# Patient Record
Sex: Male | Born: 1985 | Race: White | Hispanic: Yes | Marital: Single | Smoking: Former smoker
Health system: Southern US, Community
[De-identification: ages and names within clinical notes are randomized; demographics above are authoritative.]

---
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
## 2014-07-02 ENCOUNTER — Emergency Department (HOSPITAL_COMMUNITY)
Admission: EM | Admit: 2014-07-02 | Discharge: 2014-07-02 | Disposition: A | Discharge status: Home / Self Care | Payer: Non-veteran care | Attending: Emergency Medicine | Admitting: Emergency Medicine

## 2014-07-02 ENCOUNTER — Emergency Department (HOSPITAL_COMMUNITY): Payer: Non-veteran care

## 2014-07-02 ENCOUNTER — Encounter (HOSPITAL_COMMUNITY): Payer: Self-pay | Admitting: Emergency Medicine

## 2014-07-02 DIAGNOSIS — J069 Acute upper respiratory infection, unspecified: Secondary | ICD-10-CM | POA: Insufficient documentation

## 2014-07-02 LAB — COMPREHENSIVE METABOLIC PANEL
ALBUMIN: 4 g/dL (ref 3.5–5.2)
ALT: 27 U/L (ref 0–53)
ANION GAP: 18 — AB (ref 5–15)
AST: 24 U/L (ref 0–37)
Alkaline Phosphatase: 75 U/L (ref 39–117)
BUN: 10 mg/dL (ref 6–23)
CALCIUM: 9.4 mg/dL (ref 8.4–10.5)
CO2: 21 mEq/L (ref 19–32)
CREATININE: 0.89 mg/dL (ref 0.50–1.35)
Chloride: 96 mEq/L (ref 96–112)
GFR calc Af Amer: >90 mL/min (ref >90)
GFR calc non Af Amer: >90 mL/min (ref >90)
Glucose, Bld: 106 mg/dL — ABNORMAL HIGH (ref 70–99)
Potassium: 3.9 mEq/L (ref 3.7–5.3)
Sodium: 135 mEq/L — ABNORMAL LOW (ref 137–147)
TOTAL PROTEIN: 7.6 g/dL (ref 6.0–8.3)
Total Bilirubin: 0.4 mg/dL (ref 0.3–1.2)

## 2014-07-02 LAB — CBC WITH DIFFERENTIAL/PLATELET
BASOS ABS: 0 10*3/uL (ref 0.0–0.1)
BASOS PCT: 0 % (ref 0–1)
EOS ABS: 0 10*3/uL (ref 0.0–0.7)
Eosinophils Relative: 0 % (ref 0–5)
HCT: 36.8 % — ABNORMAL LOW (ref 39.0–52.0)
HEMOGLOBIN: 12.3 g/dL — AB (ref 13.0–17.0)
Lymphocytes Relative: 17 % (ref 12–46)
Lymphs Abs: 1.4 10*3/uL (ref 0.7–4.0)
MCH: 28.3 pg (ref 26.0–34.0)
MCHC: 33.4 g/dL (ref 30.0–36.0)
MCV: 84.6 fL (ref 78.0–100.0)
MONO ABS: 1.2 10*3/uL — AB (ref 0.1–1.0)
MONOS PCT: 14 % — AB (ref 3–12)
NEUTROS ABS: 5.8 10*3/uL (ref 1.7–7.7)
Neutrophils Relative %: 69 % (ref 43–77)
Platelets: 302 10*3/uL (ref 150–400)
RBC: 4.35 MIL/uL (ref 4.22–5.81)
RDW: 12.3 % (ref 11.5–15.5)
WBC: 8.5 10*3/uL (ref 4.0–10.5)

## 2014-07-02 LAB — RAPID STREP SCREEN (MED CTR MEBANE ONLY): STREPTOCOCCUS, GROUP A SCREEN (DIRECT): NEGATIVE

## 2014-07-02 MED ORDER — TRAMADOL HCL 50 MG PO TABS
50.0000 mg | ORAL_TABLET | Freq: Once | ORAL | Status: AC
  Administered 2014-07-02: 50 mg via ORAL
  Filled 2014-07-02: qty 1

## 2014-07-02 MED ORDER — PSEUDOEPHEDRINE HCL ER 120 MG PO TB12: 120.0000 mg | ORAL_TABLET | Freq: Two times a day (BID) | ORAL | Status: DC

## 2014-07-02 NOTE — ED Notes (Signed)
Patient here after returning from vacation in [place]. States that he was on a cruise with his family. Nobody else in his family is sick after returning from the trip. Patient currently endorsing symptoms of fever, chills, night sweats, headache, cough, and sore throat. Symptoms began before returning from trip and have persisted for about a week.

## 2014-07-02 NOTE — Discharge Instructions (Signed)
Cool Mist Vaporizers Vaporizers may help relieve the symptoms of a cough and cold. They add moisture to the air, which helps mucus to become thinner and less sticky. This makes it easier to breathe and cough up secretions. Cool mist vaporizers do not cause serious burns like hot mist vaporizers, which may also be called steamers or humidifiers. Vaporizers have not been proven to help with colds. You should not use a vaporizer if you are allergic to mold. HOME CARE INSTRUCTIONS  Follow the package instructions for the vaporizer.  Do not use anything other than distilled water in the vaporizer.  Do not run the vaporizer all of the time. This can cause mold or bacteria to grow in the vaporizer.  Clean the vaporizer after each time it is used.  Clean and dry the vaporizer well before storing it.  Stop using the vaporizer if worsening respiratory symptoms develop. Document Released: 09/07/2004 Document Revised: 12/16/2013 Document Reviewed: 04/30/2013 [facility] Patient Information 2015 [place], [place]. This information is not intended to replace advice given to you by your health care provider. Make sure you discuss any questions you have with your health care provider.  Antibiotic Resistance Antibiotics are drugs. They fight infections caused by bacteria. Antibiotics greatly reduce illness and death from infectious diseases. Over time, the bacteria that antibiotics once controlled are much harder to kill. CAUSES  Antibiotic resistance occurs when bacteria change in some way. These changes can lessen the abilities of drugs designed to cure infections. The over-use of antibiotics can cause antibiotic resistance. Almost all important bacterial infections in the world are becoming resistant to drugs. Antibiotic resistance has been called one of the world's most pressing public health problems.  Antibiotics should be used to treat bacterial infections. But they are not effective against viral infections.  These include the common cold, most sore throats, and the flu. Smart use of antibiotics will control the spread of resistance.  TREATMENT   Only use antibiotics as prescribed by your caregiver.  Talk with your caregiver about antibiotic resistance.  Ask what else you can do to feel better.  Do not take an antibiotic for a viral infection. This could be a cold, cough or the flu.  Do not save some of your antibiotic for the next time you get sick.  Take an antibiotic exactly as the caregiver tells you.  Do not take an antibiotic that is prescribed for someone else.  Use the antibiotic as directed. Take the correct dose at the scheduled time. SEEK MEDICAL CARE IF:  You react to the antibiotic with:  A rash.  Itching.  An upset stomach. Document Released: 03/03/2003 Document Revised: 03/04/2012 Document Reviewed: 10/05/2008 [facility] Patient Information 2015 [place]Care, [place]LL[place]. This information is not intended to replace advice given to you by your health care provider. Make sure you discuss any questions you have with your health care provider.

## 2014-07-02 NOTE — ED Notes (Signed)
The pt has been ill for 2-3 days with temp and headache.  Strep screen sent to lab.  Med given

## 2014-07-02 NOTE — ED Provider Notes (Signed)
CSN: [id number]     Arrival date & time 07/02/14  0302 History   First MD Initiated Contact with Patient 07/02/14 [phone]     Chief Complaint  Patient presents with  . Fever  . Headache  . Sore Throat     (Consider location/radiation/quality/duration/timing/severity/associated sxs/prior Treatment) HPI Patient is a generally healthy man in his early 30s who presents with complaints of nasal congestion, runny nose and sinus pressure. He notes that he returned from vacation in the [place] about 3 days ago and his symptoms began at that time. The patient also has a sore throat. He denies cough and shortness of breath. He has had a subjective fever but has not checked his temperature at home. His by mouth intake is a mildly diminished. He has no known ill contacts.  History reviewed. No pertinent past medical history. History reviewed. No pertinent past surgical history. No family history on file. History  Substance Use Topics  . Smoking status: Never Smoker   . Smokeless tobacco: Not on file  . Alcohol Use: No    Review of Systems  Ten point review of symptoms performed and is negative with the exception of symptoms noted above.   Allergies  Review of patient's allergies indicates no known allergies.  Home Medications   Prior to Admission medications   Not on File   BP 95/65  Pulse 102  Temp(Src) 98.7 F (37.1 C) (Oral)  Resp 20  Ht 5\' 7"  (1.702 m)  Wt 155 lb (70.308 kg)  BMI 24.27 kg/m2  SpO2 98% Physical Exam Gen: well developed and well nourished appearing Head: NCAT Eyes: PERL, EOMI Nose: no epistaixis or rhinorrhea No ttp on palpation of the maxillary and frontal sinuses Mouth/throat: mucosa is moist and pink Neck: supple, no stridor Lungs: CTA B, no wheezing, rhonchi or rales CV: RRR, no murmur, extremities appear well perfused.  Abd: soft, notender, nondistended Back: no ttp, no cva ttp Skin: warm and dry Ext: normal to inspection, no dependent edema Neuro:  CN ii-xii grossly intact, no focal deficits Psyche; normal affect,  calm and cooperative.   ED Course  Procedures (including critical care time) Labs Review  Results for orders placed during the hospital encounter of 07/02/14 (from the past 24 hour(s))  CBC WITH DIFFERENTIAL     Status: Abnormal   Collection Time    07/02/14  3:20 AM      Result Value Ref Range   WBC 8.5  4.0 - 10.5 K/uL   RBC 4.35  4.22 - 5.81 MIL/uL   Hemoglobin 12.3 (*) 13.0 - 17.0 g/dL   HCT 11.9 (*) 14.7 - 82.9 %   MCV 84.6  78.0 - 100.0 fL   MCH 28.3  26.0 - 34.0 pg   MCHC 33.4  30.0 - 36.0 g/dL   RDW 56.2  13.0 - 86.5 %   Platelets 302  150 - 400 K/uL   Neutrophils Relative % 69  43 - 77 %   Neutro Abs 5.8  1.7 - 7.7 K/uL   Lymphocytes Relative 17  12 - 46 %   Lymphs Abs 1.4  0.7 - 4.0 K/uL   Monocytes Relative 14 (*) 3 - 12 %   Monocytes Absolute 1.2 (*) 0.1 - 1.0 K/uL   Eosinophils Relative 0  0 - 5 %   Eosinophils Absolute 0.0  0.0 - 0.7 K/uL   Basophils Relative 0  0 - 1 %   Basophils Absolute 0.0  0.0 - 0.1 K/uL  COMPREHENSIVE METABOLIC PANEL     Status: Abnormal   Collection Time    07/02/14  3:20 AM      Result Value Ref Range   Sodium 135 (*) 137 - 147 mEq/L   Potassium 3.9  3.7 - 5.3 mEq/L   Chloride 96  96 - 112 mEq/L   CO2 21  19 - 32 mEq/L   Glucose, Bld 106 (*) 70 - 99 mg/dL   BUN 10  6 - 23 mg/dL   Creatinine, Ser 1.610.89  0.50 - 1.35 mg/dL   Calcium 9.4  8.4 - 09.610.5 mg/dL   Total Protein 7.6  6.0 - 8.3 g/dL   Albumin 4.0  3.5 - 5.2 g/dL   AST 24  0 - 37 U/L   ALT 27  0 - 53 U/L   Alkaline Phosphatase 75  39 - 117 U/L   Total Bilirubin 0.4  0.3 - 1.2 mg/dL   GFR calc non Af Amer >90  >90 mL/min   GFR calc Af Amer >90  >90 mL/min   Anion gap 18 (*) 5 - 15    Imaging Review Dg Chest 2 View  07/02/2014   CLINICAL DATA:  Productive cough and fever for 2 weeks. Headache. Smoker.  EXAM: CHEST  2 VIEW  COMPARISON:  None.  FINDINGS: The heart size and mediastinal contours are within  normal limits. Both lungs are clear. The visualized skeletal structures are unremarkable.  IMPRESSION: No active cardiopulmonary disease.   Electronically Signed   By: Burman NievesWilliam  Stevens M.D.   On: 07/02/2014 04:07      MDM   DDX: sinusitis, URI, pharyngitis  PE is not consistent with sinusitis. We are checking rapid strep. Suspect this is a simple URI and plan to manage symptomatically with plan for outpatient f/u and counsel re: return precautions.     Brandt LoosenJulie Alinda Egolf, MD 07/02/14 [phone]

## 2014-07-03 LAB — CULTURE, GROUP A STREP

## 2014-07-12 ENCOUNTER — Encounter (HOSPITAL_COMMUNITY): Payer: Self-pay | Admitting: Emergency Medicine

## 2014-07-12 ENCOUNTER — Emergency Department (HOSPITAL_COMMUNITY): Payer: Non-veteran care

## 2014-07-12 ENCOUNTER — Emergency Department (HOSPITAL_COMMUNITY)
Admission: EM | Admit: 2014-07-12 | Discharge: 2014-07-12 | Disposition: A | Discharge status: Home / Self Care | Payer: Non-veteran care | Attending: Emergency Medicine | Admitting: Emergency Medicine

## 2014-07-12 DIAGNOSIS — M79609 Pain in unspecified limb: Secondary | ICD-10-CM

## 2014-07-12 DIAGNOSIS — M7989 Other specified soft tissue disorders: Secondary | ICD-10-CM

## 2014-07-12 DIAGNOSIS — L02619 Cutaneous abscess of unspecified foot: Secondary | ICD-10-CM | POA: Insufficient documentation

## 2014-07-12 DIAGNOSIS — F17200 Nicotine dependence, unspecified, uncomplicated: Secondary | ICD-10-CM | POA: Insufficient documentation

## 2014-07-12 DIAGNOSIS — L03119 Cellulitis of unspecified part of limb: Principal | ICD-10-CM

## 2014-07-12 DIAGNOSIS — Z79899 Other long term (current) drug therapy: Secondary | ICD-10-CM | POA: Insufficient documentation

## 2014-07-12 DIAGNOSIS — L03116 Cellulitis of left lower limb: Secondary | ICD-10-CM

## 2014-07-12 MED ORDER — CEPHALEXIN 500 MG PO CAPS: 500.0000 mg | ORAL_CAPSULE | Freq: Three times a day (TID) | ORAL | Status: DC

## 2014-07-12 MED ORDER — HYDROCODONE-ACETAMINOPHEN 5-325 MG PO TABS
1.0000 | ORAL_TABLET | Freq: Once | ORAL | Status: AC
  Administered 2014-07-12: 1 via ORAL
  Filled 2014-07-12: qty 1

## 2014-07-12 MED ORDER — SULFAMETHOXAZOLE-TRIMETHOPRIM 800-160 MG PO TABS: 1.0000 | ORAL_TABLET | Freq: Two times a day (BID) | ORAL | Status: AC

## 2014-07-12 MED ORDER — HYDROCODONE-ACETAMINOPHEN 5-325 MG PO TABS: 1.0000 | ORAL_TABLET | Freq: Four times a day (QID) | ORAL | Status: AC | PRN

## 2014-07-12 NOTE — ED Provider Notes (Signed)
CSN: [id number]     Arrival date & time 07/12/14  1130 History  This chart was scribed for Mario CaptainAbigail Arabell Neria, PA-C,  non-physician practitioner working with Mario Rollins PayorPickering, MD by Mario Rollins, ED scribe. This patient was seen in room TR11C/TR11C and the patient's care was started at 2:40 PM.    Chief Complaint  Patient presents with  . Foot Pain   The history is provided by the patient. No language interpreter was used.   HPI Comments: Mario Rollins is a 28 y.o. male who presents to the Emergency Department complaining of left foot pain with swelling and redness; onset 3 days ago. No injury or trauma to report for current pain. Does admit he wore his [place] sneakers to work at the time the pain started; states they are not very comfortable or with much heel support. States he started feeling mild pain 3 days ago but was still able to walk and go to work. Yesterday reports he had to leave work early because pain was too severe. Also reports some pain at the left shoulder; decreased ROM. Only able to abduct halfway. Right hand dominant. Seen 2 weeks ago and diagnosed with a URI; blood work completed at that time that showed normal. Reports fever, chills, and congestion at that time; none present at the moment. No family hx of blood clot. Does admit to cigarette smoking. Denies testosterone use. Denies calf tenderness.   History reviewed. No pertinent past medical history. History reviewed. No pertinent past surgical history. History reviewed. No pertinent family history. History  Substance Use Topics  . Smoking status: Current Every Day Smoker    Types: Cigarettes  . Smokeless tobacco: Not on file  . Alcohol Use: No    Review of Systems  Musculoskeletal: Positive for arthralgias.   Allergies  Review of patient's allergies indicates no known allergies.  Home Medications   Prior to Admission medications   Medication Sig Start Date End Date Taking? Authorizing Provider   pseudoephedrine (SUDAFED 12 HOUR) 120 MG 12 hr tablet Take 1 tablet (120 mg total) by mouth 2 (two) times daily. 07/02/14   Brandt LoosenJulie Manly, MD   Triage vitals: BP 128/91  Pulse 98  Temp(Src) 98.3 F (36.8 C) (Oral)  Resp 16  Ht 5\' 7"  (1.702 m)  Wt 155 lb (70.308 kg)  BMI 24.27 kg/m2  SpO2 98%  Physical Exam  Nursing note and vitals reviewed. Constitutional: He is oriented to person, place, and time. He appears well-developed and well-nourished. No distress.  HENT:  Head: Normocephalic and atraumatic.  Eyes: Conjunctivae and EOM are normal.  Neck: Neck supple. No tracheal deviation present.  Cardiovascular: Normal rate and intact distal pulses.   Pulmonary/Chest: Effort normal. No respiratory distress.  Musculoskeletal: Normal range of motion.  Over the dorsum of of the foot there is a 7 cm area of mild swelling and erythema without streaking; warm to touch.  Small abrasion just superior to the area of discoloration  Neurological: He is alert and oriented to person, place, and time. He has normal reflexes.  Skin: Skin is warm and dry.  Psychiatric: He has a normal mood and affect. His behavior is normal.    ED Course  Procedures (including critical care time) DIAGNOSTIC STUDIES: Oxygen Saturation is 98% on room air, normal by my interpretation.    COORDINATION OF CARE: At 2:47 PM: Discussed treatment plan with patient which includes consultation for further evaulation. Patient agrees.    Labs Review Labs Reviewed - No data  to display  Imaging Review Dg Foot Complete Left  07/12/2014   CLINICAL DATA:  Left foot swelling which began approximately 2 days ago. No known injuries. Patient unable to bear weight.  EXAM: LEFT FOOT - COMPLETE 3+ VIEW  COMPARISON:  None.  FINDINGS: Diffuse soft tissue swelling. No evidence of acute fracture or dislocation. Joint spaces well preserved. Well-preserved bone mineral density. No intrinsic osseous abnormalities.  IMPRESSION: No osseous  abnormality.   Electronically Signed   By: Hulan Saas M.D.   On: 07/12/2014 13:10     EKG Interpretation None      MDM   Final diagnoses:  Cellulitis of foot, left   Patient with UL left foot swelling, tenderness, pain , and redness.  Tender to palpation on plantar surface. Will obtain a doppler study to r/o DVT.  Pain medication given.   Patient with negative doppler.xray negative for acute fracture. No known injury. Appears consistent with cellulitis. Pain meds. Abx, crutches, and post op shoe. Area demarcated.  Discussed return precautions.    I personally performed the services described in this documentation, which was scribed in my presence. The recorded information has been reviewed and is accurate.     Mario Captain, PA-C 07/12/14 [phone]

## 2014-07-12 NOTE — ED Notes (Signed)
He states hes had pain and swelling in L foot since yesterday. He denies any injuries

## 2014-07-12 NOTE — Progress Notes (Signed)
VASCULAR LAB PRELIMINARY  PRELIMINARY  PRELIMINARY  PRELIMINARY  Left lower extremity venous Doppler completed.    Preliminary report:  There is no DVT or SVT noted in the left lower extremity.   Stanley Lyness, RVT 07/12/2014, 3:34 PM

## 2014-07-12 NOTE — Discharge Instructions (Signed)
You have been diagnosed with cellulitis. I am discharging you with the following medications: 1)bactrim and keflex Please take all of your antibiotics until finished!   You may develop abdominal discomfort or diarrhea from the antibiotic.  You may help offset this with probiotics which you can buy or get in yogurt. Do not eat  or take the probiotics until 2 hours after your antibiotic.  2) percocet Do not drive, operate heavy machinery, drink alcohol, or take other tylenol containing products with this medicine. Follow up with your primary care doctor in 2-3 days. If you do not have a primary care doctor please follow up at the [facility].  HOME CARE INSTRUCTIONS   Take your antibiotics as directed. Finish them even if you start to feel better.  Keep the infected arm or leg elevated to reduce swelling.  Apply a warm cloth to the affected area up to 4 times per day to relieve pain.  Only take over-the-counter or prescription medicines for pain, discomfort, or fever as directed by your caregiver.  Keep all follow-up appointments as directed by your caregiver. SEEK MEDICAL CARE IF:   You notice red streaks coming from the infected area.  Your red area gets larger or turns dark in color.  Your bone or joint underneath the infected area becomes painful after the skin has healed.  Your infection returns in the same area or another area.  You notice a swollen bump in the infected area.  You develop new symptoms. SEEK IMMEDIATE MEDICAL CARE IF:   You have a fever.  You feel very sleepy.  You develop vomiting or diarrhea.  You have a general ill feeling (malaise) with muscle aches and pains.    Cellulitis Cellulitis is an infection of the skin and the tissue beneath it. The infected area is usually red and tender. Cellulitis occurs most often in the arms and lower legs.  CAUSES  Cellulitis is caused by bacteria that enter the skin through cracks or cuts in the skin.  The most common types of bacteria that cause cellulitis are Staphylococcus and Streptococcus. SYMPTOMS   Redness and warmth.  Swelling.  Tenderness or pain.  Fever. DIAGNOSIS  Your caregiver can usually determine what is wrong based on a physical exam. Blood tests may also be done. TREATMENT  Treatment usually involves taking an antibiotic medicine. HOME CARE INSTRUCTIONS   Take your antibiotics as directed. Finish them even if you start to feel better.  Keep the infected arm or leg elevated to reduce swelling.  Apply a warm cloth to the affected area up to 4 times per day to relieve pain.  Only take over-the-counter or prescription medicines for pain, discomfort, or fever as directed by your caregiver.  Keep all follow-up appointments as directed by your caregiver. SEEK MEDICAL CARE IF:   You notice red streaks coming from the infected area.  Your red area gets larger or turns dark in color.  Your bone or joint underneath the infected area becomes painful after the skin has healed.  Your infection returns in the same area or another area.  You notice a swollen bump in the infected area.  You develop new symptoms. SEEK IMMEDIATE MEDICAL CARE IF:   You have a fever.  You feel very sleepy.  You develop vomiting or diarrhea.  You have a general ill feeling (malaise) with muscle aches and pains. MAKE SURE YOU:   Understand these instructions.  Will watch your condition.  Will get help  right away if you are not doing well or get worse. Document Released: 09/20/2005 Document Revised: 06/11/2012 Document Reviewed: 02/26/2012 [facility] Patient Information 2015 [place], [place]. This information is not intended to replace advice given to you by your health care provider. Make sure you discuss any questions you have with your health care provider.

## 2014-07-13 NOTE — ED Provider Notes (Signed)
Medical screening examination/treatment/procedure(s) were performed by non-physician practitioner and as supervising physician I was immediately available for consultation/collaboration.   EKG Interpretation None       Kwaku Mostafa R. Eavan Gonterman, MD 07/13/14 0702 

## 2014-07-16 ENCOUNTER — Emergency Department (INDEPENDENT_AMBULATORY_CARE_PROVIDER_SITE_OTHER)
Admission: EM | Admit: 2014-07-16 | Discharge: 2014-07-16 | Disposition: A | Discharge status: Nursing Home (Includes ICF) | Payer: Self-pay | Source: Home / Self Care

## 2014-07-16 ENCOUNTER — Encounter (HOSPITAL_COMMUNITY): Payer: Self-pay | Admitting: Emergency Medicine

## 2014-07-16 ENCOUNTER — Emergency Department (HOSPITAL_COMMUNITY)
Admission: EM | Admit: 2014-07-16 | Discharge: 2014-07-17 | Disposition: A | Discharge status: Home / Self Care | Payer: Non-veteran care | Attending: Emergency Medicine | Admitting: Emergency Medicine

## 2014-07-16 ENCOUNTER — Emergency Department (HOSPITAL_COMMUNITY): Payer: Non-veteran care

## 2014-07-16 DIAGNOSIS — M609 Myositis, unspecified: Secondary | ICD-10-CM

## 2014-07-16 DIAGNOSIS — M25512 Pain in left shoulder: Secondary | ICD-10-CM

## 2014-07-16 DIAGNOSIS — F17200 Nicotine dependence, unspecified, uncomplicated: Secondary | ICD-10-CM | POA: Insufficient documentation

## 2014-07-16 DIAGNOSIS — R509 Fever, unspecified: Secondary | ICD-10-CM

## 2014-07-16 DIAGNOSIS — M25519 Pain in unspecified shoulder: Secondary | ICD-10-CM

## 2014-07-16 DIAGNOSIS — Z792 Long term (current) use of antibiotics: Secondary | ICD-10-CM | POA: Insufficient documentation

## 2014-07-16 DIAGNOSIS — IMO0001 Reserved for inherently not codable concepts without codable children: Secondary | ICD-10-CM | POA: Insufficient documentation

## 2014-07-16 DIAGNOSIS — M62838 Other muscle spasm: Secondary | ICD-10-CM

## 2014-07-16 DIAGNOSIS — Z79899 Other long term (current) drug therapy: Secondary | ICD-10-CM | POA: Insufficient documentation

## 2014-07-16 LAB — BASIC METABOLIC PANEL
ANION GAP: 13 (ref 5–15)
BUN: 12 mg/dL (ref 6–23)
CALCIUM: 9 mg/dL (ref 8.4–10.5)
CO2: 24 meq/L (ref 19–32)
Chloride: 98 mEq/L (ref 96–112)
Creatinine, Ser: 0.9 mg/dL (ref 0.50–1.35)
GFR calc Af Amer: >90 mL/min (ref >90)
GFR calc non Af Amer: >90 mL/min (ref >90)
Glucose, Bld: 96 mg/dL (ref 70–99)
Potassium: 4.2 mEq/L (ref 3.7–5.3)
SODIUM: 135 meq/L — AB (ref 137–147)

## 2014-07-16 LAB — CBC
HCT: 36.6 % — ABNORMAL LOW (ref 39.0–52.0)
Hemoglobin: 12 g/dL — ABNORMAL LOW (ref 13.0–17.0)
MCH: 28.3 pg (ref 26.0–34.0)
MCHC: 32.8 g/dL (ref 30.0–36.0)
MCV: 86.3 fL (ref 78.0–100.0)
PLATELETS: 346 10*3/uL (ref 150–400)
RBC: 4.24 MIL/uL (ref 4.22–5.81)
RDW: 12.6 % (ref 11.5–15.5)
WBC: 4.2 10*3/uL (ref 4.0–10.5)

## 2014-07-16 LAB — I-STAT CG4 LACTIC ACID, ED: Lactic Acid, Venous: 1.46 mmol/L (ref 0.5–2.2)

## 2014-07-16 MED ORDER — KETOROLAC TROMETHAMINE 60 MG/2ML IM SOLN
INTRAMUSCULAR | Status: AC
  Filled 2014-07-16: qty 2

## 2014-07-16 MED ORDER — KETOROLAC TROMETHAMINE 60 MG/2ML IM SOLN
60.0000 mg | Freq: Once | INTRAMUSCULAR | Status: AC
  Administered 2014-07-16: 60 mg via INTRAMUSCULAR

## 2014-07-16 NOTE — ED Notes (Signed)
C/o L shoulder pain onset 7/19.  No known injury.  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  Was in ED for cellulitis of L foot and was given Hydrocodone.  He told them about his shoulder and they just told him the medicine would help his shoulder.  He is taking it every 6 hrs without relief.  LD 1615.  Appears to be in acute pain. Also tried Bio freeze and muscle rub without relief.  He went into work with a little pain and pain got severe @ 1400.

## 2014-07-16 NOTE — ED Provider Notes (Signed)
Medical screening examination/treatment/procedure(s) were performed by a resident physician or non-physician practitioner and as the supervising physician I was immediately available for consultation/collaboration.  Chaeli Judy, MD Family Medicine   Yann Biehn J Zanetta Dehaan, MD 07/16/14 2148 

## 2014-07-16 NOTE — ED Notes (Signed)
Pt reports he was recently tx for cellulitis in L foot. Has also been having L should pain as well. [facility] Pt is taking antibiotics at home as prescribed.

## 2014-07-16 NOTE — ED Provider Notes (Signed)
CSN: [id number]     Arrival date & time 07/16/14  1929 History   First MD Initiated Contact with Patient 07/16/14 1956     Chief Complaint  Patient presents with  . Shoulder Pain   (Consider location/radiation/quality/duration/timing/severity/associated sxs/prior Treatment) HPI Comments: Presents with severe pain in the L shoulder, progressing over the past 5 d. It is located primarily to the L trapezius muscle from its insertion to the lateral neck, ridge and inferiorly along the body. Possibly including the suprascapular muscle and rhomboid. St the pain is severe. No known injury or trauma. 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   He developed a fever today, now 102 deg. He was see in the ED on 07/12/14 for cellulitis of the Left foot. That is resolving with the ABX.   History reviewed. No pertinent past medical history. History reviewed. No pertinent past surgical history. History reviewed. No pertinent family history. History  Substance Use Topics  . Smoking status: Current Every Day Smoker -- 0.50 packs/day    Types: Cigarettes  . Smokeless tobacco: Not on file  . Alcohol Use: No    Review of Systems  Constitutional: Negative.   Respiratory: Negative.   Gastrointestinal: Negative.   Genitourinary: Negative.   Musculoskeletal: Positive for myalgias. Negative for gait problem.       As per HPI  Skin: Negative.   Neurological: Negative for dizziness, weakness, numbness and headaches.    Allergies  Review of patient's allergies indicates no known allergies.  Home Medications   Prior to Admission medications   Medication Sig Start Date End Date Taking? Authorizing Provider  cephALEXin (KEFLEX) 500 MG capsule Take 1 capsule (500 mg total) by mouth 3 (three) times daily. 07/12/14  Yes [name], PA-C  HYDROcodone-acetaminophen (NORCO) 5-325 MG per tablet Take 1-2 tablets by mouth  every 6 (six) hours as needed for moderate pain. 07/12/14  Yes [name], PA-C  ibuprofen (ADVIL,MOTRIN) 200 MG tablet Take 800 mg by mouth every 6 (six) hours as needed for mild pain or moderate pain.    Yes Historical Provider, MD  promethazine (PHENERGAN) 12.5 MG tablet Take 12.5 mg by mouth every 6 (six) hours as needed for nausea or vomiting.   Yes Historical Provider, MD  ranitidine (ZANTAC) 150 MG tablet Take 150 mg by mouth 2 (two) times daily.   Yes Historical Provider, MD  sulfamethoxazole-trimethoprim (BACTRIM DS,SEPTRA DS) 800-160 MG per tablet Take 1 tablet by mouth 2 (two) times daily. 07/12/14 07/19/14 Yes Abigail Harris, PA-C  acetaminophen (TYLENOL) 500 MG tablet Take 500 mg by mouth every 6 (six) hours as needed for moderate pain.    Historical Provider, MD  traMADol (ULTRAM) 50 MG tablet Take 50 mg by mouth every 6 (six) hours as needed for moderate pain. Says it was his moms prescription.    Historical Provider, MD   BP 125/74  Pulse 115  Temp(Src) 102 F (38.9 C) (Oral)  Resp 18  SpO2 97% Physical Exam  Constitutional: He is oriented to person, place, and time. He appears well-developed and well-nourished.  Sitting in Wheel Chair slumped over, moaning and groaning with pain.   HENT:  Head: Normocephalic and atraumatic.  Eyes: EOM are normal.  Cardiovascular: Normal heart sounds.   Tachycardia likely due to fever.  Pulmonary/Chest: Effort normal and breath sounds normal. No respiratory distress. He has no wheezes. He has no rales.  Musculoskeletal: He  exhibits no edema.  Marked tenderness to the L trapezius muscle and the area described in HPI. Unable to abduct arm due to pain. Gripping  the L lateral neck musculature with L hand. No bony tenderness, discoloration or deformity.  Neurological: He is alert and oriented to person, place, and time. No cranial nerve deficit.  Skin: Skin is warm and dry.  Psychiatric: He has a normal mood and affect.    ED Course   Procedures (including critical care time) Labs Review Labs Reviewed - No data to display  Imaging Review No results found.   MDM   1. Shoulder pain, acute, left   2. Myofasciitis   3. Fever of undetermined origin    Toradol 60 mg IM now. The presentation of the L shoulder pain and muscle tenderness and distribution of pain along with the prolonged contraction of these muscles at work are consistent with myofasciitis, muscle pain and inflammation. However, I find no source for the fever. Since he was dx'd with cellulitis in the ED 4 d ago will transfer  To the ED for evaluation.     Mario Rasmussenavid Doni Widmer, NP 07/16/14 2026

## 2014-07-17 LAB — CK: Total CK: 118 U/L (ref 7–232)

## 2014-07-17 LAB — SEDIMENTATION RATE: Sed Rate: 49 mm/hr — ABNORMAL HIGH (ref 0–16)

## 2014-07-17 LAB — C-REACTIVE PROTEIN: CRP: 5.3 mg/dL — ABNORMAL HIGH (ref <0.60)

## 2014-07-17 MED ORDER — IBUPROFEN 600 MG PO TABS: 600.0000 mg | ORAL_TABLET | Freq: Four times a day (QID) | ORAL | Status: AC | PRN

## 2014-07-17 MED ORDER — IBUPROFEN 200 MG PO TABS
600.0000 mg | ORAL_TABLET | Freq: Once | ORAL | Status: AC
  Administered 2014-07-17: 600 mg via ORAL
  Filled 2014-07-17: qty 3

## 2014-07-17 MED ORDER — METHOCARBAMOL 500 MG PO TABS: 500.0000 mg | ORAL_TABLET | Freq: Two times a day (BID) | ORAL | Status: AC

## 2014-07-17 MED ORDER — OXYCODONE-ACETAMINOPHEN 5-325 MG PO TABS
2.0000 | ORAL_TABLET | Freq: Once | ORAL | Status: AC
  Administered 2014-07-17: 2 via ORAL
  Filled 2014-07-17: qty 2

## 2014-07-17 MED ORDER — TRAMADOL HCL 50 MG PO TABS: 50.0000 mg | ORAL_TABLET | Freq: Four times a day (QID) | ORAL | Status: AC | PRN

## 2014-07-17 MED ORDER — DIAZEPAM 5 MG PO TABS
5.0000 mg | ORAL_TABLET | Freq: Once | ORAL | Status: AC
  Administered 2014-07-17: 5 mg via ORAL
  Filled 2014-07-17: qty 1

## 2014-07-17 NOTE — ED Provider Notes (Signed)
CSN: [id number]     Arrival date & time 07/16/14  2041 History   First MD Initiated Contact with Patient 07/17/14 0005     Chief Complaint  Patient presents with  . Shoulder Pain     (Consider location/radiation/quality/duration/timing/severity/associated sxs/prior Treatment) HPI Comments: Pt comes in with cc of shoulder pain. Pt reports that he started having shoulder pain, mild last week. He is right handed pain is to the left shoulder. Pain has increased significantly over the course of time, especially today - as he is unable to move. In the interim, he was seen in the ER for foot swelling - and was started on cellulitis tx - which is responding to the antibiotics. Pt's pain is located on the left side - starting at the base of the neck (trapezius), and extends posteriorly -in the scapular region. Pain is worse with any movement of the shoulder, and pain is in the back of his shoulder and scapular region. 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 Pt was seen at urgent care, and asked to come to the ER - as he had fevers, 102. Pt thiks the fevers started y'day. No risk factors for bacteremia or septic joint - i.e. No ivda, immunocompromised status, injection injuries.  Patient is a 28 y.o. male presenting with shoulder pain. The history is provided by the patient.  Shoulder Pain Pertinent negatives include no chest pain, no abdominal pain and no shortness of breath.    History reviewed. No pertinent past medical history. History reviewed. No pertinent past surgical history. No family history on file. History  Substance Use Topics  . Smoking status: Current Every Day Smoker -- 0.50 packs/day    Types: Cigarettes  . Smokeless tobacco: Not on file  . Alcohol Use: No    Review of Systems  Constitutional: Positive for fever. Negative for chills, activity change and appetite change.  HENT: Negative for congestion and dental problem.   Respiratory: Negative for cough  and shortness of breath.   Cardiovascular: Negative for chest pain.  Gastrointestinal: Negative for nausea, vomiting and abdominal pain.  Endocrine: Negative for polyuria.  Genitourinary: Negative for dysuria.  Musculoskeletal: Positive for arthralgias and myalgias. Negative for neck pain and neck stiffness.  Skin: Negative for rash and wound.  Allergic/Immunologic: Negative for immunocompromised state.      Allergies  Review of patient's allergies indicates no known allergies.  Home Medications   Prior to Admission medications   Medication Sig Start Date End Date Taking? Authorizing Provider  acetaminophen (TYLENOL) 500 MG tablet Take 500 mg by mouth every 6 (six) hours as needed for moderate pain.   Yes Historical Provider, MD  cephALEXin (KEFLEX) 500 MG capsule Take 1 capsule (500 mg total) by mouth 3 (three) times daily. 07/12/14  Yes [name], PA-C  HYDROcodone-acetaminophen (NORCO) 5-325 MG per tablet Take 1-2 tablets by mouth every 6 (six) hours as needed for moderate pain. 07/12/14  Yes [name], PA-C  ibuprofen (ADVIL,MOTRIN) 200 MG tablet Take 800 mg by mouth every 6 (six) hours as needed for mild pain or moderate pain.    Yes Historical Provider, MD  promethazine (PHENERGAN) 12.5 MG tablet Take 12.5 mg by mouth every 6 (six) hours as needed for nausea or vomiting.   Yes Historical Provider, MD  ranitidine (ZANTAC) 150 MG tablet Take 150 mg by mouth daily.    Yes Historical Provider, MD  sulfamethoxazole-trimethoprim (BACTRIM DS,SEPTRA DS) 800-160 MG per tablet Take 1  tablet by mouth 2 (two) times daily. 07/12/14 07/19/14 Yes Abigail Harris, PA-C   BP 97/58  Pulse 72  Temp(Src) 97.9 F (36.6 C) (Oral)  Resp 18  SpO2 98% Physical Exam  Nursing note and vitals reviewed. Constitutional: He is oriented to person, place, and time. He appears well-developed.  HENT:  Head: Normocephalic and atraumatic.  Eyes: Conjunctivae and EOM are normal. Pupils are equal, round,  and reactive to light.  Neck: Normal range of motion. Neck supple.  Cardiovascular: Normal rate and regular rhythm.   Pulmonary/Chest: Effort normal and breath sounds normal.  Abdominal: Soft. Bowel sounds are normal. He exhibits no distension. There is no tenderness. There is no rebound and no guarding.  Musculoskeletal:  Left shoulder : GROSSLY, no rubor, color or edema appreciated. Pt has no tenderness with palpation of the GH joint or AC joint. Pt is holding the arm adducted and internally rotated. Pt unable to abduct the arm, due to pain. On passive ROM - i was able to fully abduct the shoulder with minimal discomfort. There was no stiffness. I was able to internally and externally rotate the shoulder -with minimal discomfort to the scapular region. Palpation of the scapular region reproduces tenderness, and the same is true for the trapezius muscle region. Pt's neck ROM is intact. No spasm appreciated on exam  Neurological: He is alert and oriented to person, place, and time.  Skin: Skin is warm.    ED Course  Procedures (including critical care time) Labs Review Labs Reviewed  CBC - Abnormal; Notable for the following:    Hemoglobin 12.0 (*)    HCT 36.6 (*)    All other components within normal limits  BASIC METABOLIC PANEL - Abnormal; Notable for the following:    Sodium 135 (*)    All other components within normal limits  SEDIMENTATION RATE - Abnormal; Notable for the following:    Sed Rate 49 (*)    All other components within normal limits  CK  C-REACTIVE PROTEIN  I-STAT CG4 LACTIC ACID, ED    Imaging Review Dg Shoulder Left  07/16/2014   CLINICAL DATA:  Left shoulder pain.  No known injury.  EXAM: LEFT SHOULDER - 2+ VIEW  COMPARISON:  None.  FINDINGS: The mineralization and alignment are normal. There is no evidence of acute fracture or dislocation. The subacromial space is preserved. There is no significant arthropathy.  IMPRESSION: Negative.   Electronically Signed    By: Roxy HorsemanBill  Veazey M.D.   On: 07/16/2014 21:52     EKG Interpretation None     3:32 AM  Pt's labs reviewed. No leukocytosis. Sed rate is slightly elevated. Repeat exam reveals + abduction actively by the patient. He stated that the toradol shot previously relieved his pain drastically, and now oral meds here also helped. Continues to point to the trapezius region for pain and the scapular region. Repeat palpation of the shoulder joint reveals no pain. Pt states that he has been having pain for a week now, and he even went to work today - highly unlikely again for this to be septic joint based on that info. CK is normal - and again, the joint grossly shows no signs of infection. Pt is febrile - and we have no source. Return precautions have been discussed. I dont think any further evaluation for septic joint is warranted.  MDM   Final diagnoses:  Myositis  Fever, unspecified fever cause  Muscle spasm    Pt comes in with cc  of shoulder pain and fevers. Pain is not in the shoulder primarily - but in the scapular region and in the trapezius region. Initial exam reveals no tenderness over the shoulder joint - and though active ROM was severely limited, patient tolerated passive ROM just fine over the shoulder. The shoulder shows no signs of infection. Pain was reproducible with palpation of the scapular and trapz region. Pt given oral meds, labs ordered - and repeat exam was reassuring. Not sure what is causing fever. No murmurs, no risk factors for endocarditis or septic joint.  Pt will return to the ER DURING THE DAY TIME PREFERABLY if the pain is getting worse. I have explained to him that why my concerns for septic joint are low - and discussed the warning signs for the same condition.      Derwood Kaplan, MD 07/17/14 [phone]

## 2014-07-17 NOTE — ED Notes (Signed)
This RN went to discharge the pt, when the pt stated "my stomach just started hurting really bad". Pt states that this sometimes happens after he is given percocet. This RN gave the pt saltine crackers and a ginger ale to help ease the pt's stomach pain.

## 2014-07-17 NOTE — ED Notes (Signed)
Pt states he is feeling better and is ready to go home.

## 2014-07-17 NOTE — Discharge Instructions (Signed)
We saw you in the ER for All the results in the ER are normal or not overly concerning. We are not sure what is causing your symptoms. The workup in the ER is not complete, and is limited to screening for life threatening and emergent conditions only.  Please return to the ER if your symptoms worsen; you have increased pain, chills, sweats, inability to keep any medications down, confusion.  READ THE INFORMATION PROVIDED BELOW ON SEPTIC JOINT - RETURN IF YOU SHOW SIGNS OF IT.  Take medications prescribed.  Bone and Joint Infections Joint infections are called septic or infectious arthritis. An infected joint may damage cartilage and tissue very quickly. This may destroy the joint. Bone infections (osteomyelitis) may last for years. Joints may become stiff if left untreated. Bacteria are the most common cause. Other causes include viruses and fungi, but these are more rare. Bone and joint infections usually come from injury or infection elsewhere in your body; the germs are carried to your bones or joints through the bloodstream.  CAUSES   Blood-carried germs from an infection elsewhere in your body can eventually spread to a bone or joint. The germ staphylococcus is the most common cause of both osteomyelitis and septic arthritis.  An injury can introduce germs into your bones or joints. SYMPTOMS   Weight loss.  Tiredness.  Chills and fever.  Bone or joint pain at rest and with activity.  Tenderness when touching the area or bending the joint.  Refusal to bear weight on a leg or inability to use an arm due to pain.  Decreased range of motion in a joint.  Skin redness, warmth, and tenderness.  Open skin sores and drainage. RISK FACTORS Children, the elderly, and those with weak immune systems are at increased risk of bone and joint infections. It is more common in people with HIV infections and with people on chemotherapy. People are also at increased risk if they have surgery  where metal implants are used to stabilize the bone. Plates, screws, or artificial joints provide a surface that bacteria can stick on. Such a growth of bacteria is called biofilm. The biofilm protects bacteria from antibiotics and bodily defenses. This allows germs to multiply. Other reasons for increased risks include:   Having previous surgery or injury of a bone or joint.  Being on high-dose corticosteroids and immunosuppressive medications that weaken your body's resistance to germs.  Diabetes and long-standing diseases.  Use of intravenous street drugs.  Being on hemodialysis.  Having a history of urinary tract infections.  Removal of your spleen (splenectomy). This weakens your immunity.  Chronic viral infections such as HIV or AIDS.  Lack of sensation such as paraplegia, quadriplegia, or spina bifida. DIAGNOSIS   Increased numbers of white blood cells in your blood may indicate infection. Some times your caregivers are able to identify the infecting germs by testing your blood. Inflammatory markers present in your bloodstream such as an erythrocyte sedimentation rate (ESR or sed rate) or c-reactive protein (CRP) can be indicators of deep infection.  Bone scans and X-ray exams are necessary for diagnosing osteomyelitis. They may help your caregiver find the infected areas. Other studies may give more detailed information. They may help detect fluid collections around a joint, abnormal bone surfaces, or be useful in diagnosing septic arthritis. They can find soft tissue swelling and find excess fluid in an infected joint or the adjacent bone. These tests include:  Ultrasound.  CT (computerized tomography).  MRI (magnetic resonance imaging).  The best test for diagnosing a bone or joint infection is an aspiration or biopsy. Your caregiver will usually use a local anesthetic. He or she can then remove tissue from a bone injury or use a needle to take fluids from an infected joint.  A local anesthetic medication numbs the area to be biopsied. Often biopsies are done in the operating room under general anesthesia. This means you will be asleep during the procedure. Tests performed on these samples can identify an infection. TREATMENT   Treatment can help control long-standing infections, but infections may come back.  Infections can infect any bone or joint at any age.  Bone and joint infections are rarely fatal.  Bone infection left untreated can become a never-ending infection. It can spread to other areas of your body. It may eventually cause bone death. Reduced limb or joint function can result. In severe cases, this may require removal of a limb. Spinal osteomyelitis is very dangerous. Untreated, it may damage spinal nerves and cause death.  The most common complication of septic arthritis is osteoarthritis with pain and decreased range of motion of the joint. Some forms of treatment may include:  If the infection is caused by bacteria, it is generally treated with antibiotics. You will likely receive the drugs through a vein (intravenously) for anywhere from 2 to 6 weeks. In some cases, especially with children, oral antibiotics following an initial intravenous dose may be effective. The treatment you receive depends on the:  Type of bacteria.  Location of the infection.  Type of surgery that might be done.  Other health conditions or issues you might have.  Your caregiver may drain soft tissue abscesses or pockets of fluid around infected bones or joints. If you have septic arthritis, your caregiver may use a needle to drain pus from the joint on a daily basis. He or she may use an arthroscope to clean the joint or may need to open the joint surgically to remove damaged tissue and infection. An arthroscope is an instrument like a thin lighted telescope. It can be used to look inside the joint.  Surgery is usually needed if the infection has become long-standing.  It may also be needed if there is hardware (such as metal plates, screws, or artificial joints) inside the patient. Sometimes a bone or muscle graft is needed to fill in the open space. This promotes growth of new tissues and better blood flow to the area. PREVENTION   Clean and disinfect wounds quickly to help prevent the start of a bone or joint infection. Get treatment for any infections to prevent spread to a bone or joint.  Do not smoke. Smoking decreases healing rates of bone and predisposes to infection.  When given medications that suppress your immune system, use them according to your caregiver's instructions. Do not take more than prescribed for your condition.  Take good care of your feet and skin, especially if you have diabetes, decreased sensation or circulation problems. SEEK IMMEDIATE MEDICAL CARE IF:   You cannot bear weight on a leg or use an arm, especially following a minor injury. This can be a sign of bone or joint infection.  You think you may have signs or symptoms of a bone or joint infection. Your chance of getting rid of an infection is better if treated early. Document Released: 12/11/2005 Document Revised: 03/04/2012 Document Reviewed: 11/10/2009 [facility] Patient Information 2015 [place], [place]. This information is not intended to replace advice given to you by your  health care provider. Make sure you discuss any questions you have with your health care provider. RICE: Routine Care for Injuries The routine care of many injuries includes Rest, Ice, Compression, and Elevation (RICE). HOME CARE INSTRUCTIONS  Rest is needed to allow your body to heal. Routine activities can usually be resumed when comfortable. Injured tendons and bones can take up to 6 weeks to heal. Tendons are the cord-like structures that attach muscle to bone.  Ice following an injury helps keep the swelling down and reduces pain.  Put ice in a plastic bag.  Place a towel between your skin  and the bag.  Leave the ice on for 15-20 minutes, 3-4 times a day, or as directed by your health care provider. Do this while awake, for the first 24 to 48 hours. After that, continue as directed by your caregiver.  Compression helps keep swelling down. It also gives support and helps with discomfort. If an elastic bandage has been applied, it should be removed and reapplied every 3 to 4 hours. It should not be applied tightly, but firmly enough to keep swelling down. Watch fingers or toes for swelling, bluish discoloration, coldness, numbness, or excessive pain. If any of these problems occur, remove the bandage and reapply loosely. Contact your caregiver if these problems continue.  Elevation helps reduce swelling and decreases pain. With extremities, such as the arms, hands, legs, and feet, the injured area should be placed near or above the level of the heart, if possible. SEEK IMMEDIATE MEDICAL CARE IF:  You have persistent pain and swelling.  You develop redness, numbness, or unexpected weakness.  Your symptoms are getting worse rather than improving after several days. These symptoms may indicate that further evaluation or further X-rays are needed. Sometimes, X-rays may not show a small broken bone (fracture) until 1 week or 10 days later. Make a follow-up appointment with your caregiver. Ask when your X-ray results will be ready. Make sure you get your X-ray results. Document Released: 03/25/2001 Document Revised: 12/16/2013 Document Reviewed: 05/12/2011 [facility] Patient Information 2015 [place], [place]. This information is not intended to replace advice given to you by your health care provider. Make sure you discuss any questions you have with your health care provider.

## 2018-09-22 ENCOUNTER — Encounter (HOSPITAL_COMMUNITY): Payer: Self-pay

## 2018-09-22 ENCOUNTER — Emergency Department (HOSPITAL_COMMUNITY)
Admission: EM | Admit: 2018-09-22 | Discharge: 2018-09-22 | Disposition: A | Discharge status: Home / Self Care | Payer: Non-veteran care | Attending: Emergency Medicine | Admitting: Emergency Medicine

## 2018-09-22 DIAGNOSIS — Z79899 Other long term (current) drug therapy: Secondary | ICD-10-CM | POA: Insufficient documentation

## 2018-09-22 DIAGNOSIS — M7918 Myalgia, other site: Secondary | ICD-10-CM | POA: Insufficient documentation

## 2018-09-22 DIAGNOSIS — F17210 Nicotine dependence, cigarettes, uncomplicated: Secondary | ICD-10-CM | POA: Diagnosis not present

## 2018-09-22 DIAGNOSIS — L509 Urticaria, unspecified: Secondary | ICD-10-CM

## 2018-09-22 DIAGNOSIS — L299 Pruritus, unspecified: Secondary | ICD-10-CM | POA: Insufficient documentation

## 2018-09-22 MED ORDER — DIPHENHYDRAMINE HCL 50 MG/ML IJ SOLN
50.0000 mg | Freq: Once | INTRAMUSCULAR | Status: AC
Start: 2018-09-22 — End: 2018-09-22
  Administered 2018-09-22: 50 mg via INTRAVENOUS
  Filled 2018-09-22: qty 1

## 2018-09-22 MED ORDER — METHYLPREDNISOLONE SODIUM SUCC 125 MG IJ SOLR
125.0000 mg | Freq: Once | INTRAMUSCULAR | Status: AC
  Administered 2018-09-22: 125 mg via INTRAVENOUS
  Filled 2018-09-22: qty 2

## 2018-09-22 MED ORDER — PREDNISONE 20 MG PO TABS: 60.0000 mg | ORAL_TABLET | Freq: Every day | ORAL | 0 refills | Status: DC

## 2018-09-22 MED ORDER — FAMOTIDINE IN NACL 20-0.9 MG/50ML-% IV SOLN
20.0000 mg | Freq: Once | INTRAVENOUS | Status: AC
  Administered 2018-09-22: 20 mg via INTRAVENOUS
  Filled 2018-09-22: qty 50

## 2018-09-22 MED ORDER — FAMOTIDINE 20 MG PO TABS: 20.0000 mg | ORAL_TABLET | Freq: Two times a day (BID) | ORAL | 0 refills | Status: AC

## 2018-09-22 NOTE — Discharge Instructions (Signed)
You may use Benadryl 50 mg every 8 hours as needed for rash and itching.

## 2018-09-22 NOTE — ED Provider Notes (Signed)
TIME SEEN: 5:12 AM  CHIEF COMPLAINT: Hives  HPI: Patient is a 32 year old male with no significant past medical history who was seen in urgent care today for flulike symptoms and received an IM injection of Toradol.  States afterwards he developed diffuse pruritic hives.  No lip or tongue swelling.  No difficulty breathing or speaking.  No difficulty swallowing.  No other new exposures.  ROS: See HPI Constitutional: no fever  Eyes: no drainage  ENT: no runny nose   Cardiovascular:  no chest pain  Resp: no SOB  GI: no vomiting GU: no dysuria Integumentary: no rash  Allergy:  hives  Musculoskeletal: no leg swelling  Neurological: no slurred speech ROS otherwise negative  PAST MEDICAL HISTORY/PAST SURGICAL HISTORY:  History reviewed. No pertinent past medical history.  MEDICATIONS:  Prior to Admission medications   Medication Sig Start Date End Date Taking? Authorizing Provider  acetaminophen (TYLENOL) 500 MG tablet Take 500 mg by mouth every 6 (six) hours as needed for moderate pain.    [provider]  cephALEXin (KEFLEX) 500 MG capsule Take 1 capsule (500 mg total) by mouth 3 (three) times daily. 07/12/14   [name], PA-C  HYDROcodone-acetaminophen (NORCO) 5-325 MG per tablet Take 1-2 tablets by mouth every 6 (six) hours as needed for moderate pain. 07/12/14   [name], PA-C  ibuprofen (ADVIL,MOTRIN) 200 MG tablet Take 800 mg by mouth every 6 (six) hours as needed for mild pain or moderate pain.     [provider]  ibuprofen (ADVIL,MOTRIN) 600 MG tablet Take 1 tablet (600 mg total) by mouth every 6 (six) hours as needed. 07/17/14   Derwood Kaplan, MD  methocarbamol (ROBAXIN) 500 MG tablet Take 1 tablet (500 mg total) by mouth 2 (two) times daily. 07/17/14   Derwood Kaplan, MD  promethazine (PHENERGAN) 12.5 MG tablet Take 12.5 mg by mouth every 6 (six) hours as needed for nausea or vomiting.    [provider]  ranitidine (ZANTAC) 150 MG tablet  Take 150 mg by mouth daily.     [provider]  traMADol (ULTRAM) 50 MG tablet Take 1 tablet (50 mg total) by mouth every 6 (six) hours as needed. 07/17/14   Derwood Kaplan, MD    ALLERGIES:  No Known Allergies  SOCIAL HISTORY:  Social History   Tobacco Use  . Smoking status: Current Every Day Smoker    Packs/day: 0.50    Types: Cigarettes  . Smokeless tobacco: Never Used  Substance Use Topics  . Alcohol use: No    FAMILY HISTORY: No family history on file.  EXAM: BP 103/71   Pulse 88   Temp 99.5 F (37.5 C) (Oral)   Resp 16   SpO2 93%  CONSTITUTIONAL: Alert and oriented and responds appropriately to questions. Well-appearing; well-nourished HEAD: Normocephalic EYES: Conjunctivae clear, pupils appear equal, EOMI ENT: normal nose; moist mucous membranes, normal speech, no angioedema, handling secretions without difficulty, no stridor NECK: Supple, no meningismus, no nuchal rigidity, no LAD  CARD: RRR; S1 and S2 appreciated; no murmurs, no clicks, no rubs, no gallops RESP: Normal chest excursion without splinting or tachypnea; breath sounds clear and equal bilaterally; no wheezes, no rhonchi, no rales, no hypoxia or respiratory distress, speaking full sentences ABD/GI: Normal bowel sounds; non-distended; soft, non-tender, no rebound, no guarding, no peritoneal signs, no hepatosplenomegaly BACK:  The back appears normal and is non-tender to palpation, there is no CVA tenderness EXT: Normal ROM in all joints; non-tender to palpation; no edema;  normal capillary refill; no cyanosis, no calf tenderness or swelling    SKIN: Normal color for age and race; warm; diffuse urticaria, no petechiae or purpura, no blisters or desquamation NEURO: Moves all extremities equally PSYCH: The patient's mood and manner are appropriate. Grooming and personal hygiene are appropriate.  MEDICAL DECISION MAKING: Patient here with allergic reaction likely from Toradol.  Has taken NSAIDs in the  past without difficulty.  Have advised him to avoid Toradol in the future.  We will add this to his allergy list.  Has tried oral Benadryl over-the-counter without relief.  Will give IV Benadryl, Solu-Medrol, Pepcid and reassess.  No sign of infectious etiology.  Suspect viral illness causing his flulike symptoms.  He was negative for influenza today at urgent care.  ED PROGRESS: Patient's hives have improved.  Will discharge with prednisone taper, Pepcid and instructions to use Benadryl.  Discussed return precautions.  He is comfortable with this plan.   At this time, I do not feel there is any life-threatening condition present. I have reviewed and discussed all results (EKG, imaging, lab, urine as appropriate) and exam findings with patient/family. I have reviewed nursing notes and appropriate previous records.  I feel the patient is safe to be discharged home without further emergent workup and can continue workup as an outpatient as needed. Discussed usual and customary return precautions. Patient/family verbalize understanding and are comfortable with this plan.  Outpatient follow-up has been provided if needed. All questions have been answered.      Ward, Layla Maw, DO 09/22/18 [phone]

## 2018-09-22 NOTE — ED Notes (Signed)
Pt stable, ambulatory, and verbalizes understanding of d/c instructions.  

## 2018-09-22 NOTE — ED Triage Notes (Signed)
Pt reports that for the past several days he has had flu like symptoms, body aches, congestion, headaches, went to UC and tested neg for flu, given shot of Toradol for pain and since has been itching.

## 2021-06-11 ENCOUNTER — Other Ambulatory Visit: Payer: Self-pay

## 2021-06-11 ENCOUNTER — Encounter (HOSPITAL_BASED_OUTPATIENT_CLINIC_OR_DEPARTMENT_OTHER): Payer: Self-pay | Admitting: *Deleted

## 2021-06-11 ENCOUNTER — Ambulatory Visit (HOSPITAL_COMMUNITY)
Admission: EM | Admit: 2021-06-11 | Discharge: 2021-06-11 | Disposition: A | Discharge status: Home / Self Care | Payer: Non-veteran care | Attending: Family Medicine | Admitting: Family Medicine

## 2021-06-11 ENCOUNTER — Emergency Department (HOSPITAL_BASED_OUTPATIENT_CLINIC_OR_DEPARTMENT_OTHER)
Admission: EM | Admit: 2021-06-11 | Discharge: 2021-06-11 | Disposition: A | Discharge status: Home / Self Care | Payer: No Typology Code available for payment source | Attending: Emergency Medicine | Admitting: Emergency Medicine

## 2021-06-11 DIAGNOSIS — W540XXA Bitten by dog, initial encounter: Secondary | ICD-10-CM | POA: Diagnosis not present

## 2021-06-11 DIAGNOSIS — Z23 Encounter for immunization: Secondary | ICD-10-CM | POA: Insufficient documentation

## 2021-06-11 DIAGNOSIS — S61210A Laceration without foreign body of right index finger without damage to nail, initial encounter: Secondary | ICD-10-CM | POA: Diagnosis not present

## 2021-06-11 DIAGNOSIS — Z87891 Personal history of nicotine dependence: Secondary | ICD-10-CM | POA: Diagnosis not present

## 2021-06-11 DIAGNOSIS — S61250A Open bite of right index finger without damage to nail, initial encounter: Secondary | ICD-10-CM

## 2021-06-11 MED ORDER — TETANUS-DIPHTH-ACELL PERTUSSIS 5-2.5-18.5 LF-MCG/0.5 IM SUSY
0.5000 mL | PREFILLED_SYRINGE | Freq: Once | INTRAMUSCULAR | Status: AC
  Administered 2021-06-11: 0.5 mL via INTRAMUSCULAR
  Filled 2021-06-11: qty 0.5

## 2021-06-11 MED ORDER — AMOXICILLIN-POT CLAVULANATE 875-125 MG PO TABS
1.0000 | ORAL_TABLET | Freq: Once | ORAL | Status: AC
  Administered 2021-06-11: 1 via ORAL
  Filled 2021-06-11: qty 1

## 2021-06-11 MED ORDER — AMOXICILLIN-POT CLAVULANATE 875-125 MG PO TABS: 1.0000 | ORAL_TABLET | Freq: Two times a day (BID) | ORAL | 0 refills | Status: AC

## 2021-06-11 NOTE — ED Notes (Signed)
Pt verbalizes understanding of discharge instructions. Opportunity for questioning and answers were provided. Armand removed by staff, pt discharged from ED to home. Instructed to pick up meds from pharmacy.

## 2021-06-11 NOTE — ED Notes (Addendum)
Pt advised by provider  Ignacia Bayley, PA to go to ED for further evaluation and treatment of dog bite

## 2021-06-11 NOTE — ED Provider Notes (Signed)
[facility] EMERGENCY DEPT Provider Note   CSN: [id number] Arrival date & time: 06/11/21  1645     History Chief Complaint  Patient presents with   Animal Bite    Mario Rollins is a 35 y.o. male.  HPI 35 year old male presents with a right index finger laceration from dog bite.  It is his dog.  The dog was on the yard and not wanting to come back in and he went to go pick it up and it bit him.  Shots are up-to-date.  Otherwise the dog has not been sick.  No numbness or weakness or change in range of motion of his finger.  Mild to moderate pain.  Went to urgent care and told to come to the ER.  History reviewed. No pertinent past medical history.  There are no problems to display for this patient.   History reviewed. No pertinent surgical history.     No family history on file.  Social History   Tobacco Use   Smoking status: Former    Packs/day: 0.50    Pack years: 0.00    Types: Cigarettes   Smokeless tobacco: Never  Substance Use Topics   Alcohol use: No   Drug use: No    Home Medications Prior to Admission medications   Medication Sig Start Date End Date Taking? Authorizing Provider  amoxicillin-clavulanate (AUGMENTIN) 875-125 MG tablet Take 1 tablet by mouth 2 (two) times daily. One po bid x 7 days 06/11/21  Yes Pricilla Loveless, MD  acetaminophen (TYLENOL) 500 MG tablet Take 500 mg by mouth every 6 (six) hours as needed for moderate pain.    [provider]  famotidine (PEPCID) 20 MG tablet Take 1 tablet (20 mg total) by mouth 2 (two) times daily. 09/22/18   Ward, Layla Maw, DO  HYDROcodone-acetaminophen (NORCO) 5-325 MG per tablet Take 1-2 tablets by mouth every 6 (six) hours as needed for moderate pain. 07/12/14   [name], PA-C  ibuprofen (ADVIL,MOTRIN) 200 MG tablet Take 800 mg by mouth every 6 (six) hours as needed for mild pain or moderate pain.     [provider]  ibuprofen (ADVIL,MOTRIN) 600 MG tablet Take 1 tablet (600  mg total) by mouth every 6 (six) hours as needed. 07/17/14   Derwood Kaplan, MD  methocarbamol (ROBAXIN) 500 MG tablet Take 1 tablet (500 mg total) by mouth 2 (two) times daily. 07/17/14   Derwood Kaplan, MD  predniSONE (DELTASONE) 20 MG tablet Take 3 tablets (60 mg total) by mouth daily. 09/22/18   Ward, Layla Maw, DO  promethazine (PHENERGAN) 12.5 MG tablet Take 12.5 mg by mouth every 6 (six) hours as needed for nausea or vomiting.    [provider]  ranitidine (ZANTAC) 150 MG tablet Take 150 mg by mouth daily.     [provider]  traMADol (ULTRAM) 50 MG tablet Take 1 tablet (50 mg total) by mouth every 6 (six) hours as needed. 07/17/14   Derwood Kaplan, MD    Allergies    Toradol [ketorolac tromethamine]  Review of Systems   Review of Systems  Musculoskeletal:  Negative for arthralgias.  Skin:  Positive for wound.  Neurological:  Negative for weakness and numbness.   Physical Exam Updated Vital Signs BP 119/79 (BP Location: Left Arm)   Pulse 97   Temp 98.6 F (37 C) (Oral)   Resp 17   Ht 5\' 7"  (1.702 m)   Wt 70.3 kg   SpO2 99%   BMI 24.28  kg/m   Physical Exam Vitals and nursing note reviewed.  Constitutional:      Appearance: He is well-developed.  HENT:     Head: Normocephalic and atraumatic.     Right Ear: External ear normal.     Left Ear: External ear normal.     Nose: Nose normal.  Eyes:     General:        Right eye: No discharge.        Left eye: No discharge.  Cardiovascular:     Rate and Rhythm: Normal rate and regular rhythm.     Pulses:          Radial pulses are 2+ on the right side.  Pulmonary:     Effort: Pulmonary effort is normal.  Abdominal:     General: There is no distension.  Musculoskeletal:     Right hand: Laceration present. Normal range of motion.       Arms:     Cervical back: Neck supple.  Skin:    General: Skin is warm and dry.  Neurological:     Mental Status: He is alert.  Psychiatric:        Mood and  Affect: Mood is not anxious.    ED Results / Procedures / Treatments   Labs (all labs ordered are listed, but only abnormal results are displayed) Labs Reviewed - No data to display  EKG None  Radiology No results found.  Procedures Procedures   Medications Ordered in ED Medications  amoxicillin-clavulanate (AUGMENTIN) 875-125 MG per tablet 1 tablet (1 tablet Oral Given 06/11/21 1914)  Tdap (BOOSTRIX) injection 0.5 mL (0.5 mLs Intramuscular Given 06/11/21 1912)    ED Course  I have reviewed the triage vital signs and the nursing notes.  Pertinent labs & imaging results that were available during my care of the patient were reviewed by me and considered in my medical decision making (see chart for details).    MDM Rules/Calculators/A&P                          Patient presents with a finger laceration after dog bite.  Dog shots are up-to-date.  I do not think rabies vaccines are needed but he needs to watch the dog.  We will update tetanus.  Discussed that suturing in this area is not a good idea due to high risk for infection.  We will put on Augmentin but he otherwise appears stable for discharge home with supportive care.  He placed his finger under the sink for around 10 minutes significantly irrigate out.  No obvious contamination.  Given return precautions. Final Clinical Impression(s) / ED Diagnoses Final diagnoses:  Open wound of right index finger due to dog bite    Rx / DC Orders ED Discharge Orders          Ordered    amoxicillin-clavulanate (AUGMENTIN) 875-125 MG tablet  2 times daily        06/11/21 1913             Pricilla Loveless, MD 06/11/21 1935

## 2021-06-11 NOTE — ED Triage Notes (Signed)
Pt bitten on rt index finger by his poodle. Dog is UTD with shots. Bleeding controlled

## 2021-08-01 ENCOUNTER — Other Ambulatory Visit: Payer: Self-pay

## 2021-08-01 ENCOUNTER — Emergency Department (INDEPENDENT_AMBULATORY_CARE_PROVIDER_SITE_OTHER): Payer: No Typology Code available for payment source

## 2021-08-01 ENCOUNTER — Emergency Department (INDEPENDENT_AMBULATORY_CARE_PROVIDER_SITE_OTHER)
Admission: EM | Admit: 2021-08-01 | Discharge: 2021-08-01 | Disposition: A | Discharge status: Home / Self Care | Payer: No Typology Code available for payment source | Source: Home / Self Care

## 2021-08-01 DIAGNOSIS — M549 Dorsalgia, unspecified: Secondary | ICD-10-CM

## 2021-08-01 DIAGNOSIS — S39012A Strain of muscle, fascia and tendon of lower back, initial encounter: Secondary | ICD-10-CM | POA: Diagnosis not present

## 2021-08-01 DIAGNOSIS — M62830 Muscle spasm of back: Secondary | ICD-10-CM | POA: Diagnosis not present

## 2021-08-01 MED ORDER — ACETAMINOPHEN 325 MG PO TABS
650.0000 mg | ORAL_TABLET | Freq: Once | ORAL | Status: AC
  Administered 2021-08-01: 650 mg via ORAL

## 2021-08-01 MED ORDER — BACLOFEN 10 MG PO TABS: 10.0000 mg | ORAL_TABLET | Freq: Three times a day (TID) | ORAL | 0 refills | Status: AC

## 2021-08-01 MED ORDER — PREDNISONE 20 MG PO TABS: ORAL_TABLET | ORAL | 0 refills | Status: AC

## 2021-08-01 NOTE — Discharge Instructions (Addendum)
Advised patient to take medication as directed with food to completion. Advised patient to discontinue Robaxin (methocarbamol) and start Baclofen with prednisone taper.  Advised/encouraged patient to avoid moderate to strenuous activities including lifting, twisting, turning, repetitive/overuse movements involving affected area of lower back for the next 7 to 10 days.  Encouraged patient increase daily water intake while taking these medications.

## 2021-08-01 NOTE — ED Triage Notes (Addendum)
Pt presents with lower back pain on the left side. 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 Pt st he has tried multiple things for pain relief ( heating pads, baths, icy hot, tylenol, motrin, rx muscle relaxer's) but it has just been unmanageable. Pt does not recall hurting his back in anyway.  Pt st he went to a chriopracter 6 months ago and they did xrays that was abnormal.

## 2021-08-01 NOTE — ED Notes (Signed)
Pts mother called for his request for a ride. Mario Rollins updated and st she is on the way to pick him up.

## 2021-08-01 NOTE — ED Provider Notes (Signed)
[facility]    CSN: [id number] Arrival date & time: 08/01/21  1632      History   Chief Complaint Chief Complaint  Patient presents with   Back Pain    Left   SIDE     HPI Mario Rollins is a 35 y.o. male.   HPI 35 year old male presents with left-sided low back pain for 2 weeks.  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  Patient reports trying OTC analgesics heating pad IcyHot, and muscle relaxers.  Reports was followed by chiropractor 6 months ago and report previous x-rays of lower back were normal.  History reviewed. No pertinent past medical history.  There are no problems to display for this patient.   History reviewed. No pertinent surgical history.     Home Medications    Prior to Admission medications   Medication Sig Start Date End Date Taking? Authorizing Provider  baclofen (LIORESAL) 10 MG tablet Take 1 tablet (10 mg total) by mouth 3 (three) times daily. 08/01/21  Yes Trevor Iha, FNP  escitalopram (LEXAPRO) 10 MG tablet Take 10 mg by mouth daily.   Yes [provider]  predniSONE (DELTASONE) 20 MG tablet Take 3 tabs PO daily x 3 days, then 2 tabs PO daily x 3 days, then 1 tab PO daily x 3 days 08/01/21  Yes Trevor Iha, FNP  acetaminophen (TYLENOL) 500 MG tablet Take 500 mg by mouth every 6 (six) hours as needed for moderate pain.    [provider]  amoxicillin-clavulanate (AUGMENTIN) 875-125 MG tablet Take 1 tablet by mouth 2 (two) times daily. One po bid x 7 days 06/11/21   Pricilla Loveless, MD  famotidine (PEPCID) 20 MG tablet Take 1 tablet (20 mg total) by mouth 2 (two) times daily. 09/22/18   Ward, Layla Maw, DO  HYDROcodone-acetaminophen (NORCO) 5-325 MG per tablet Take 1-2 tablets by mouth every 6 (six) hours as needed for moderate pain. 07/12/14   [name], PA-C  ibuprofen (ADVIL,MOTRIN) 200 MG tablet Take 800 mg by mouth every 6 (six) hours as needed for  mild pain or moderate pain.     [provider]  ibuprofen (ADVIL,MOTRIN) 600 MG tablet Take 1 tablet (600 mg total) by mouth every 6 (six) hours as needed. 07/17/14   Derwood Kaplan, MD  methocarbamol (ROBAXIN) 500 MG tablet Take 1 tablet (500 mg total) by mouth 2 (two) times daily. 07/17/14   Derwood Kaplan, MD  promethazine (PHENERGAN) 12.5 MG tablet Take 12.5 mg by mouth every 6 (six) hours as needed for nausea or vomiting.    [provider]  ranitidine (ZANTAC) 150 MG tablet Take 150 mg by mouth daily.     [provider]  traMADol (ULTRAM) 50 MG tablet Take 1 tablet (50 mg total) by mouth every 6 (six) hours as needed. 07/17/14   Derwood Kaplan, MD    Family History History reviewed. No pertinent family history.  Social History Social History   Tobacco Use   Smoking status: Former    Packs/day: 0.50    Types: Cigarettes   Smokeless tobacco: Never  Vaping Use   Vaping Use: Every day   Substances: Nicotine, Flavoring  Substance Use Topics   Alcohol use: No   Drug use: No     Allergies   Toradol [ketorolac tromethamine]   Review of Systems Review of Systems  Musculoskeletal:  Positive for back pain.  All other  systems reviewed and are negative.   Physical Exam Triage Vital Signs ED Triage Vitals  Enc Vitals Group     BP 08/01/21 1657 116/73     Pulse Rate 08/01/21 1657 75     Resp 08/01/21 1657 (!) 25     Temp 08/01/21 1657 98.7 F (37.1 C)     Temp Source 08/01/21 1657 Oral     SpO2 08/01/21 1657 99 %     Weight 08/01/21 1652 152 lb (68.9 kg)     Height 08/01/21 1652 5\' 7"  (1.702 m)     Head Circumference --      Peak Flow --      Pain Score 08/01/21 1651 10     Pain Loc --      Pain Edu? --      Excl. in GC? --    No data found.  Updated Vital Signs BP 116/73 (BP Location: Left Arm)   Pulse 75   Temp 98.7 F (37.1 C) (Oral)   Resp (!) 25   Ht 5\' 7"  (1.702 m)   Wt 152 lb (68.9 kg)   SpO2 99%   BMI 23.81 kg/m    Physical Exam Vitals and nursing note reviewed.  Constitutional:      General: He is not in acute distress.    Appearance: Normal appearance. He is normal weight. He is ill-appearing.  HENT:     Head: Normocephalic and atraumatic.     Mouth/Throat:     Mouth: Mucous membranes are moist.     Pharynx: Oropharynx is clear.  Eyes:     Extraocular Movements: Extraocular movements intact.     Conjunctiva/sclera: Conjunctivae normal.     Pupils: Pupils are equal, round, and reactive to light.  Cardiovascular:     Rate and Rhythm: Normal rate and regular rhythm.     Pulses: Normal pulses.     Heart sounds: Normal heart sounds.  Pulmonary:     Effort: Pulmonary effort is normal. No respiratory distress.     Breath sounds: No stridor. No wheezing, rhonchi or rales.  Musculoskeletal:     Cervical back: Normal range of motion and neck supple. No tenderness.     Comments: Lumbar sacral spine (inferior left-sided aspects): TTP over spinous processes, left-sided paraspinous muscles and left-sided spinal erector with numerous palpable muscle adhesions noted, exam limited due to pain  Lymphadenopathy:     Cervical: No cervical adenopathy.  Skin:    General: Skin is warm and dry.  Neurological:     General: No focal deficit present.     Mental Status: He is alert and oriented to person, place, and time. Mental status is at baseline.  Psychiatric:        Mood and Affect: Mood normal.        Behavior: Behavior normal.        Thought Content: Thought content normal.     UC Treatments / Results  Labs (all labs ordered are listed, but only abnormal results are displayed) Labs Reviewed - No data to display  EKG   Radiology DG Lumbar Spine Complete  Result Date: 08/01/2021 CLINICAL DATA:  Two weeks of intermittent severe back pain worse this morning. EXAM: LUMBAR SPINE - COMPLETE 4+ VIEW COMPARISON:  None. FINDINGS: Five lumbar type vertebral bodies. There is no evidence of lumbar spine  fracture. Alignment is normal. Intervertebral disc spaces are maintained. IMPRESSION: No acute osseous abnormality. Electronically Signed   By: MD  On: 08/01/2021 18:43    Procedures Procedures (including critical care time)  Medications Ordered in UC Medications  acetaminophen (TYLENOL) tablet 650 mg (650 mg Oral Given 08/01/21 1703)    Initial Impression / Assessment and Plan / UC Course  I have reviewed the triage vital signs and the nursing notes.  Pertinent labs & imaging results that were available during my care of the patient were reviewed by me and considered in my medical decision making (see chart for details).     MDM: 1.  Strain of lumbar region, initial encounter-next Prednisone taper; 2.  Muscle spasm of back-Rx'd Baclofen. Advised patient to take medication as directed with food to completion.  I asked patient to discontinue Robaxin (methocarbamol) and start baclofen with prednisone taper.  Advised/encouraged patient to avoid moderate to strenuous activities including lifting, twisting, turning, repetitive/overuse movements involving affected area of lower back for the next 7 to 10 days.  Encouraged patient increase daily water intake while taking these medications.  Discharged home, hemodynamically stable. Final Clinical Impressions(s) / UC Diagnoses   Final diagnoses:  Strain of lumbar region, initial encounter  Muscle spasm of back     Discharge Instructions      Advised patient to take medication as directed with food to completion.  I asked patient to discontinue Robaxin (methocarbamol) and start baclofen with prednisone taper.  Advised/encouraged patient to avoid moderate to strenuous activities including lifting, twisting, turning, repetitive/overuse movements involving affected area of lower back for the next 7 to 10 days.  Encouraged patient increase daily water intake while taking these medications.     ED Prescriptions     Medication Sig  Dispense Auth. Provider   predniSONE (DELTASONE) 20 MG tablet Take 3 tabs PO daily x 3 days, then 2 tabs PO daily x 3 days, then 1 tab PO daily x 3 days 18 tablet Trevor Iha, FNP   baclofen (LIORESAL) 10 MG tablet Take 1 tablet (10 mg total) by mouth 3 (three) times daily. 30 each Trevor Iha, FNP      PDMP not reviewed this encounter.   Trevor Iha, FNP 08/01/21 1906

## 2021-08-01 NOTE — ED Notes (Signed)
Pt struggling to get off xray table. Nurse assisted pt to standing position and pt expressed a lot of pain, pt walked back to room with crutches due to pain in hip and lower back area. Pt st it feels better to stand than to sit.

## 2022-02-17 IMAGING — DX DG LUMBAR SPINE COMPLETE 4+V
5 series · 5 of 5 positions shown · non-contrast
Comparison: None.

CLINICAL DATA: Two weeks of intermittent severe back pain worse
this morning.

EXAM:
LUMBAR SPINE - COMPLETE 4+ VIEW

[l-spine ap (1 of 2)]
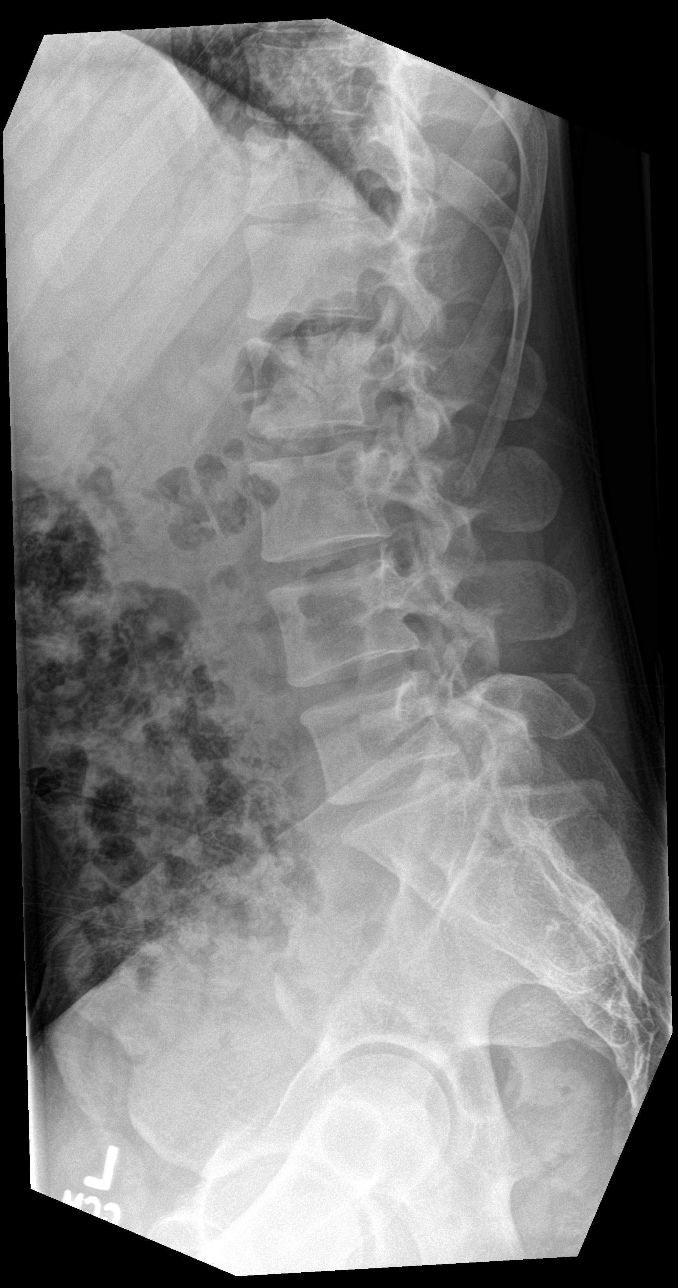

[l-spine obl (1 of 2)]
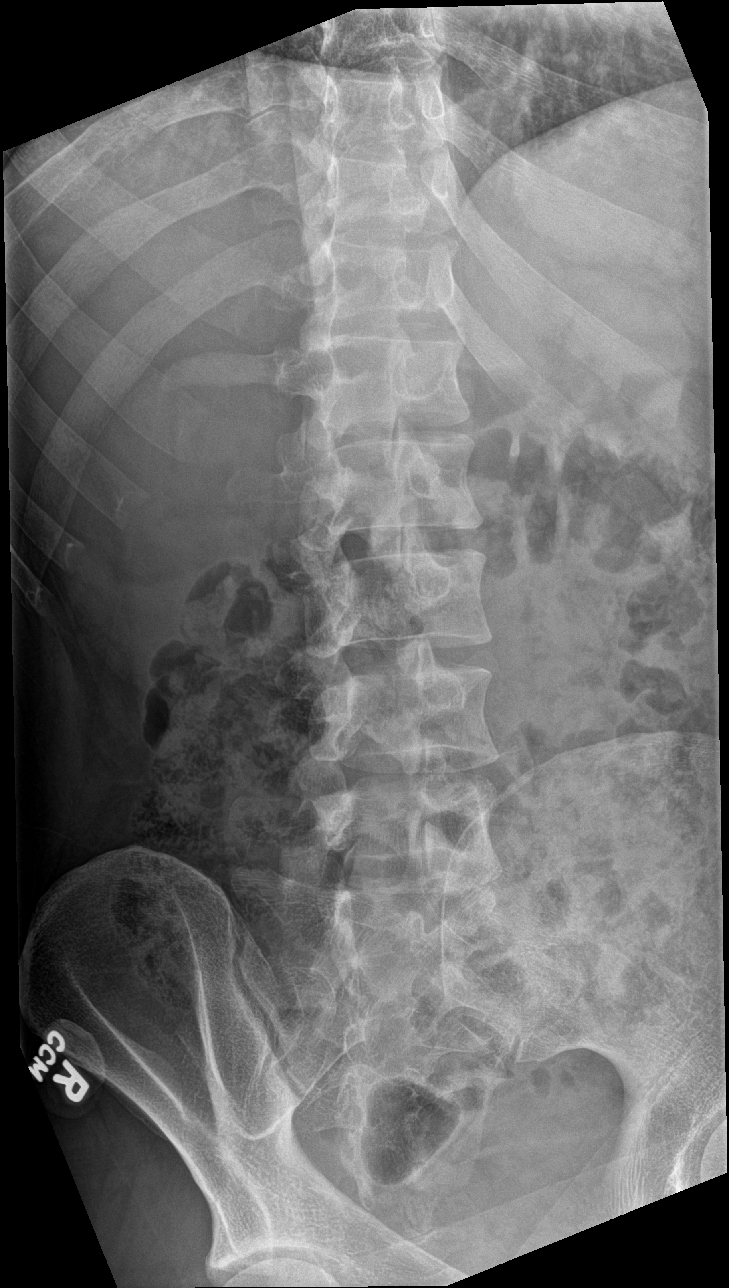

[l-spine obl (2 of 2)]
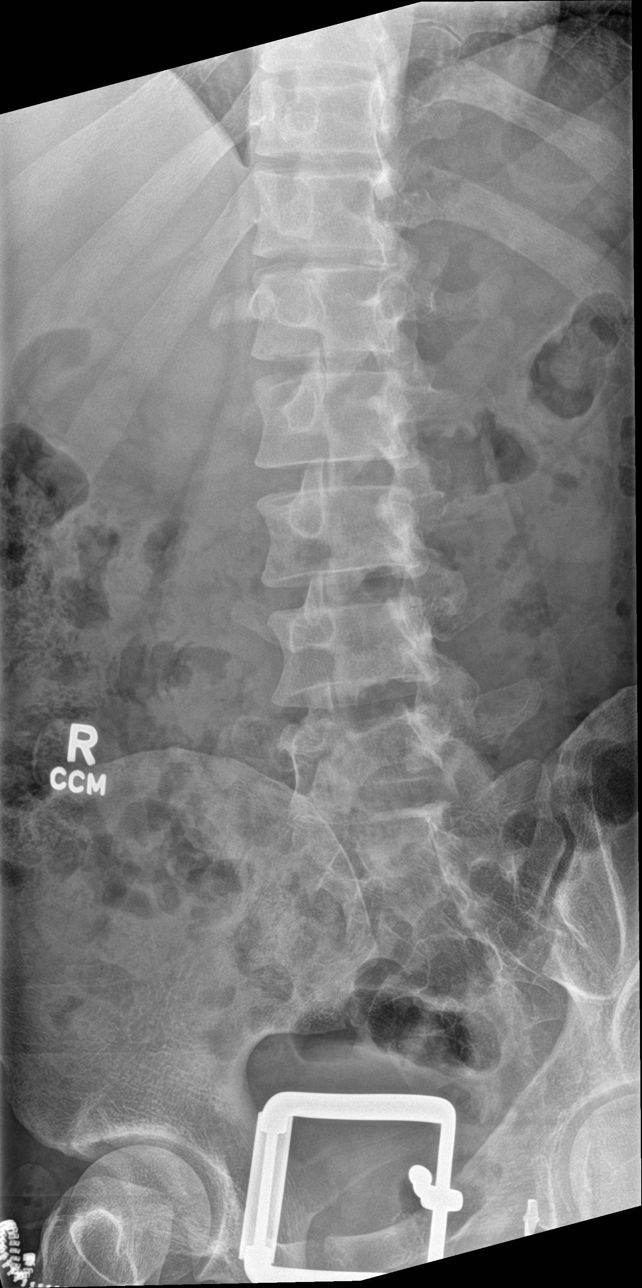

[l-spine spot]
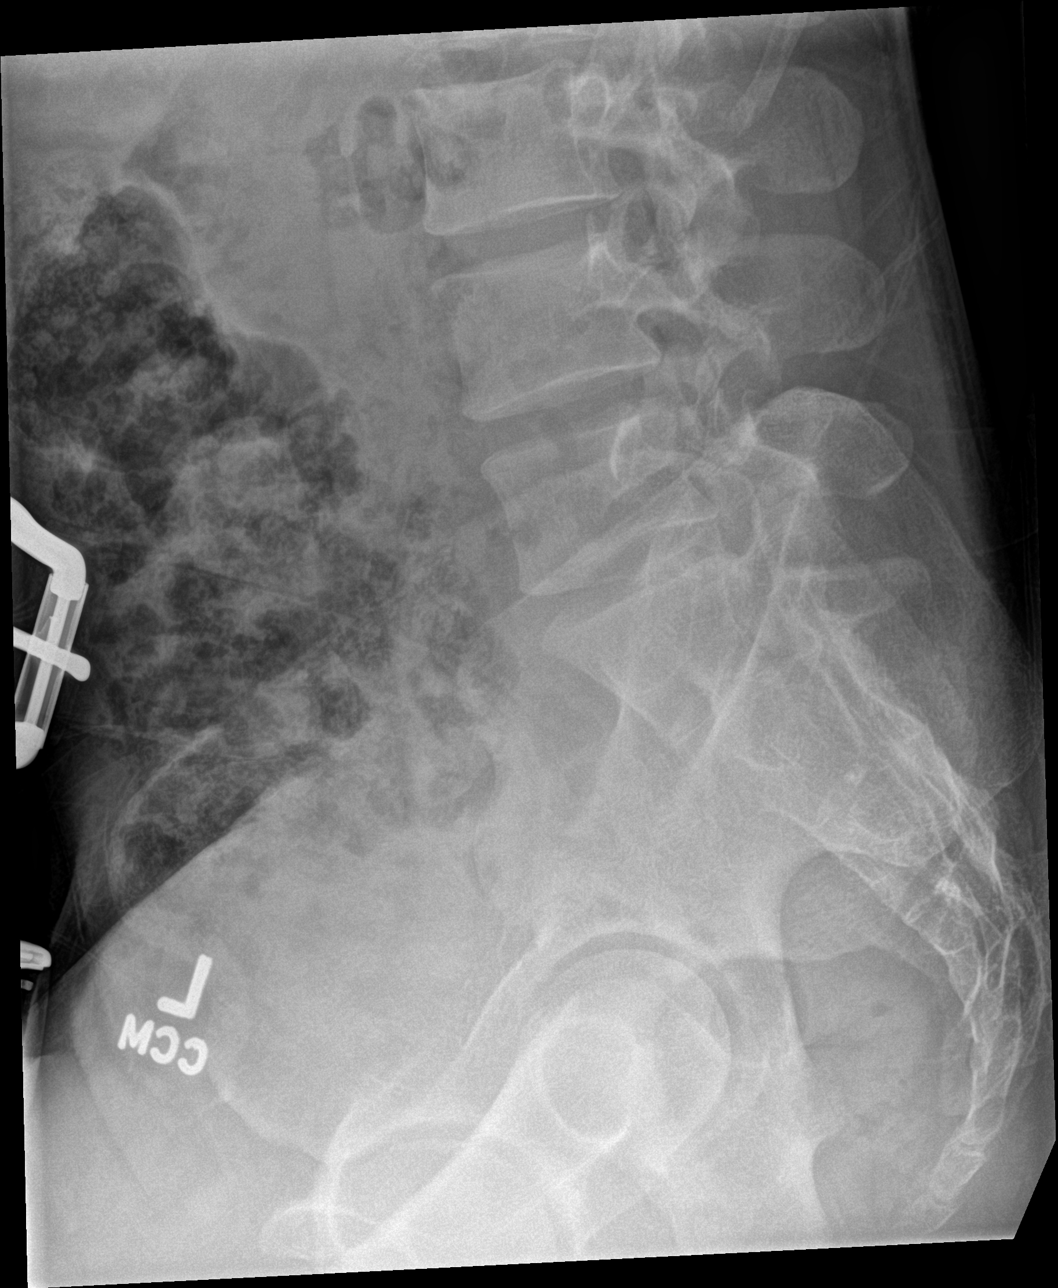

[l-spine ap (2 of 2)]
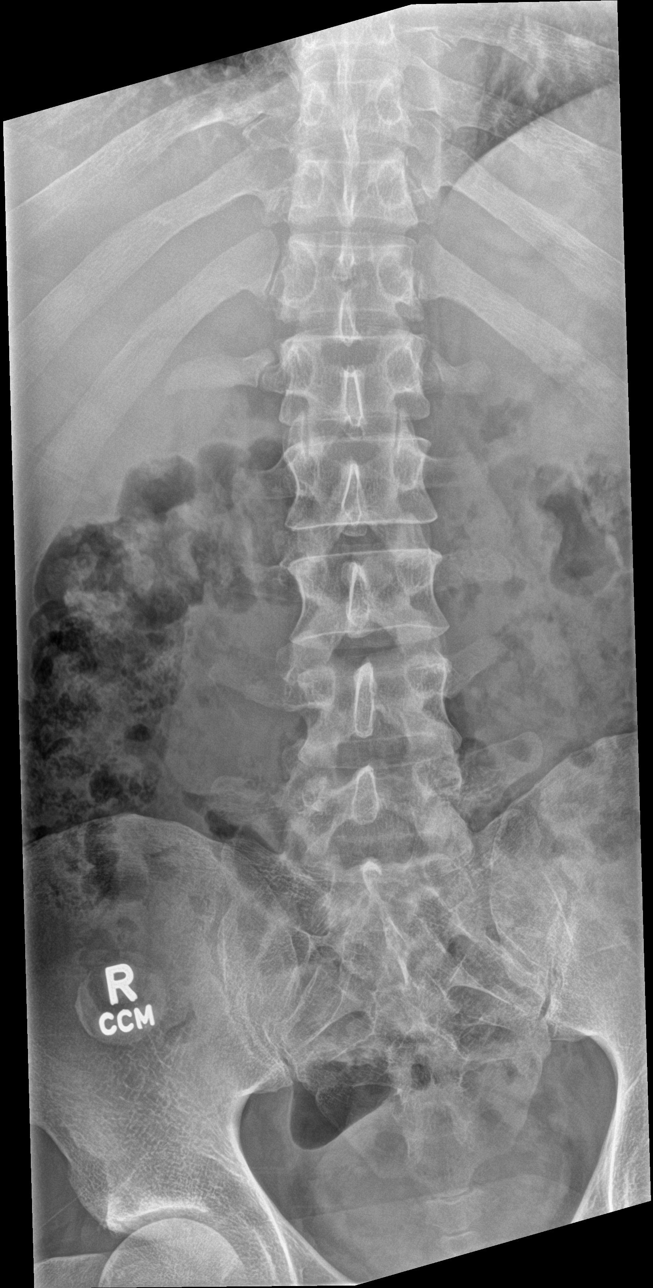

[5 of 5 positions shown; findings below may reference images not displayed]

FINDINGS: Five lumbar type vertebral bodies. There is no evidence of lumbar
spine fracture. Alignment is normal. Intervertebral disc spaces are
maintained.
IMPRESSION: No acute osseous abnormality.
